# Patient Record
Sex: Female | Born: 1950 | Race: White | Hispanic: No | Marital: Married | Smoking: Never smoker
Health system: Southern US, Community
[De-identification: ages and names within clinical notes are randomized; demographics above are authoritative.]

## PROBLEM LIST (undated history)

## (undated) DIAGNOSIS — C801 Malignant (primary) neoplasm, unspecified: Secondary | ICD-10-CM

## (undated) DIAGNOSIS — N946 Dysmenorrhea, unspecified: Secondary | ICD-10-CM

## (undated) DIAGNOSIS — M722 Plantar fascial fibromatosis: Secondary | ICD-10-CM

## (undated) DIAGNOSIS — E079 Disorder of thyroid, unspecified: Secondary | ICD-10-CM

## (undated) DIAGNOSIS — N979 Female infertility, unspecified: Secondary | ICD-10-CM

## (undated) DIAGNOSIS — D219 Benign neoplasm of connective and other soft tissue, unspecified: Secondary | ICD-10-CM

## (undated) DIAGNOSIS — D649 Anemia, unspecified: Secondary | ICD-10-CM

## (undated) DIAGNOSIS — M810 Age-related osteoporosis without current pathological fracture: Secondary | ICD-10-CM

## (undated) HISTORY — DX: Plantar fascial fibromatosis: M72.2

## (undated) HISTORY — PX: DILATION AND CURETTAGE OF UTERUS: SHX78

## (undated) HISTORY — DX: Dysmenorrhea, unspecified: N94.6

## (undated) HISTORY — DX: Disorder of thyroid, unspecified: E07.9

## (undated) HISTORY — DX: Age-related osteoporosis without current pathological fracture: M81.0

## (undated) HISTORY — DX: Female infertility, unspecified: N97.9

## (undated) HISTORY — DX: Benign neoplasm of connective and other soft tissue, unspecified: D21.9

## (undated) HISTORY — DX: Anemia, unspecified: D64.9

## (undated) HISTORY — DX: Malignant (primary) neoplasm, unspecified: C80.1

---
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
## 1986-09-04 HISTORY — PX: PELVIC LAPAROSCOPY: SHX162

## 1998-09-04 DIAGNOSIS — C801 Malignant (primary) neoplasm, unspecified: Secondary | ICD-10-CM

## 1998-09-04 DIAGNOSIS — E079 Disorder of thyroid, unspecified: Secondary | ICD-10-CM

## 1998-09-04 HISTORY — DX: Malignant (primary) neoplasm, unspecified: C80.1

## 1998-09-04 HISTORY — DX: Disorder of thyroid, unspecified: E07.9

## 2000-10-12 ENCOUNTER — Encounter (INDEPENDENT_AMBULATORY_CARE_PROVIDER_SITE_OTHER): Payer: Self-pay | Admitting: Specialist

## 2000-10-12 ENCOUNTER — Other Ambulatory Visit: Admission: RE | Admit: 2000-10-12 | Discharge: 2000-10-12 | Payer: Self-pay | Admitting: Obstetrics and Gynecology

## 2001-04-22 ENCOUNTER — Ambulatory Visit (HOSPITAL_COMMUNITY): Admission: RE | Admit: 2001-04-22 | Discharge: 2001-04-22 | Payer: Self-pay | Admitting: Gynecology

## 2001-04-22 ENCOUNTER — Encounter: Payer: Self-pay | Admitting: Gynecology

## 2003-08-12 ENCOUNTER — Other Ambulatory Visit: Admission: RE | Admit: 2003-08-12 | Discharge: 2003-08-12 | Payer: Self-pay | Admitting: Obstetrics and Gynecology

## 2003-08-27 ENCOUNTER — Ambulatory Visit (HOSPITAL_COMMUNITY): Admission: RE | Admit: 2003-08-27 | Discharge: 2003-08-27 | Payer: Self-pay | Admitting: Obstetrics and Gynecology

## 2003-08-27 ENCOUNTER — Encounter (INDEPENDENT_AMBULATORY_CARE_PROVIDER_SITE_OTHER): Payer: Self-pay

## 2003-10-09 ENCOUNTER — Encounter (INDEPENDENT_AMBULATORY_CARE_PROVIDER_SITE_OTHER): Payer: Self-pay | Admitting: *Deleted

## 2003-10-09 ENCOUNTER — Ambulatory Visit (HOSPITAL_COMMUNITY): Admission: RE | Admit: 2003-10-09 | Discharge: 2003-10-09 | Payer: Self-pay | Admitting: Gastroenterology

## 2004-10-17 ENCOUNTER — Other Ambulatory Visit: Admission: RE | Admit: 2004-10-17 | Discharge: 2004-10-17 | Payer: Self-pay | Admitting: Obstetrics and Gynecology

## 2005-11-24 ENCOUNTER — Other Ambulatory Visit: Admission: RE | Admit: 2005-11-24 | Discharge: 2005-11-24 | Payer: Self-pay | Admitting: Obstetrics and Gynecology

## 2006-09-04 DIAGNOSIS — M810 Age-related osteoporosis without current pathological fracture: Secondary | ICD-10-CM

## 2006-09-04 HISTORY — DX: Age-related osteoporosis without current pathological fracture: M81.0

## 2009-03-09 ENCOUNTER — Other Ambulatory Visit: Admission: RE | Admit: 2009-03-09 | Discharge: 2009-03-09 | Payer: Self-pay | Admitting: Family Medicine

## 2009-04-20 ENCOUNTER — Encounter: Admission: RE | Admit: 2009-04-20 | Discharge: 2009-04-20 | Payer: Self-pay | Admitting: Internal Medicine

## 2011-01-20 NOTE — Op Note (Signed)
NAME:  Wanda Miller, Wanda Miller                         ACCOUNT NO.:  [id number]   MEDICAL RECORD NO.:  [id number]                   PATIENT TYPE:  AMB   LOCATION:  ENDO                                 FACILITY:  [facility]   PHYSICIAN:  John C. Madilyn Fireman, M.D.                 DATE OF BIRTH:  1951/05/24   DATE OF PROCEDURE:  10/09/2003  DATE OF DISCHARGE:                                 OPERATIVE REPORT   PROCEDURE:  Colonoscopy.   INDICATIONS FOR PROCEDURE:  Colon cancer screening in a 60 year old patient  with no prior screening.   DESCRIPTION OF PROCEDURE:  The patient was placed in the left lateral  decubitus position and placed on the pulse monitor with continuous low flow  oxygen delivered by nasal cannula. She was sedated with 100 micrograms of IV  Demerol and 10 mg of IV Versed.   The Olympus video colonoscope was inserted into the rectum and advanced to  the cecum, confirmed by transillumination of McBurney's point and  visualization of the ileocecal valve and appendiceal orifice.  The prep was  excellent.   The cecum, ascending, transverse, descending and sigmoid colon all appeared  normal with no masses, polyps, diverticula or other mucosal abnormalities.  Within the rectum there was seen an 8-mm sessile polyp which was fulgurated  by hot biopsy. The remainder of the rectum appeared normal.   The colonoscope was then withdrawn and the patient was returned to the  recovery room in stable condition. She tolerated the procedure well and  there were no immediate complications.   IMPRESSION:  Rectal polyp, otherwise normal study.   PLAN:  Await histology to determine method and interval for future colon  screening.                                               John C. Madilyn Fireman, M.D.    JCH/MEDQ  D:  10/09/2003  T:  10/09/2003  Job:  [id number]   cc:   Vikki Ports, M.D.  [address]  [place]  [place] [address]  Fax: [phone]

## 2011-01-20 NOTE — Op Note (Signed)
NAME:  Wanda Miller, Wanda Miller                         ACCOUNT NO.:  [id number]   MEDICAL RECORD NO.:  [id number]                   PATIENT TYPE:  AMB   LOCATION:  SDC                                  FACILITY:  [facility]   PHYSICIAN:  Randye Lobo, M.D.                DATE OF BIRTH:  07/27/1951   DATE OF PROCEDURE:  08/27/2003  DATE OF DISCHARGE:                                 OPERATIVE REPORT   PREOPERATIVE DIAGNOSES:  1. Cervical fibroid.  2. Uterine leiomyomata.  3. Postmenopausal bleeding.   POSTOPERATIVE DIAGNOSIS:  Uterine leiomyomata.   PROCEDURES:  1. Diagnostic hysteroscopy.  2. Dilation and curettage.   SURGEON:  Randye Lobo, M.D.   ANESTHESIA:  LMA, paracervical block with 1% lidocaine.   FLUIDS REPLACED:  1000 mL Ringer's lactate.   ESTIMATED BLOOD LOSS:  Minimal.   URINE OUTPUT:  150 mL.   COMPLICATIONS:  None.   INDICATION FOR PROCEDURE:  The patient is a 60 year old gravida 48 Caucasian  female on estrogen and progesterone therapy for menopausal symptoms, who  presented to the office for a usual examination, noting intermittent  spotting.  The patient has known uterine leiomyomata from prior ultrasound  evaluation.  Exam in the office demonstrated a prolapsing cervical fibroid.  This fibroid was felt to be firmly attached and was small.  It was not  possible to remove it in an office setting.  A plan was made to proceed with  a hysteroscopy with D&C and removal of the cervical fibroid after risks,  benefits, and alternatives were discussed with her.   FINDINGS:  Exam under anesthesia revealed a 10-week size uterus with a large  posterior lower uterine segment and fundal fibroid.  No adnexal masses were  appreciated.   At the time of hysteroscopy, the uterine cavity was noted to be almost  completely obliterated with both large anterior and posterior fibroids  encompassing essentially the entire fundus and lower uterine segment.  These  both did have  submucosal components to them, the posterior fibroid being  larger than the anterior fibroid.  There was thickened endometrium which was  appreciated.  There was no evidence of a cervical fibroid at the time of the  hysteroscopy-D&C.  The ostia of the fallopian tubes could not be visualized  well secondary to the large fibroids.   SPECIMENS:  Endometrial curettings were sent to pathology.   DESCRIPTION OF PROCEDURE:  With an IV in place, the patient was taken to the  operating room after she was properly identified.  The patient did receive  Ancef 1 g intravenously for antibiotic prophylaxis.  In the operating room,  LMA anesthesia was induced and the patient was then placed in the dorsal  lithotomy position.  The vagina and perineum were sterilely prepped and  draped and a Foley catheter was sterilely placed inside the bladder.  Then  the exam under anesthesia was performed  and the findings are as noted above.   A speculum was placed inside the vagina and the single-tooth tenaculum was  placed on the anterior cervical lip.  The uterus was sounded to  approximately 10 cm.  A paracervical block was performed with a total of 10  mL of 1% lidocaine in standard fashion.  The cervix was dilated at this time  to a #19 Pratt dilator, and the hysteroscope was inserted through the cervix  into the uterine cavity under the continuous infusion of 3% sorbitol  solution.  The findings are as noted above.  The diagnostic hysteroscope was  removed, and the cervix was further dilated to a #23 Pratt dilator.  A  smooth and then a serrated curette were used to curette the thickened  endometrium in all four quadrants of the uterine cavity, and the specimen  was then sent to pathology.  The diagnostic hysteroscope was inserted after  a final curettage was performed.  There was no evidence of any thickened  endometrium remaining.  The hysteroscope was removed, as were the remaining  vaginal instruments and  the Foley catheter.  Hemostasis was noted to be  excellent.   The patient was cleansed of any remaining Betadine.  She was awakened and  escorted to the recovery room in stable condition.  There were no  complications to the procedure.  All needle, instrument, and sponge counts  were correct.                                               Randye Lobo, M.D.    BES/MEDQ  D:  08/27/2003  T:  08/27/2003  Job:  [id number]

## 2011-08-22 ENCOUNTER — Encounter: Payer: Self-pay | Admitting: Emergency Medicine

## 2011-08-22 ENCOUNTER — Emergency Department (HOSPITAL_COMMUNITY): Payer: No Typology Code available for payment source

## 2011-08-22 ENCOUNTER — Emergency Department (HOSPITAL_COMMUNITY)
Admission: EM | Admit: 2011-08-22 | Discharge: 2011-08-22 | Disposition: A | Discharge status: Home / Self Care | Payer: No Typology Code available for payment source | Attending: Emergency Medicine | Admitting: Emergency Medicine

## 2011-08-22 DIAGNOSIS — S2020XA Contusion of thorax, unspecified, initial encounter: Secondary | ICD-10-CM | POA: Insufficient documentation

## 2011-08-22 DIAGNOSIS — S300XXA Contusion of lower back and pelvis, initial encounter: Secondary | ICD-10-CM

## 2011-08-22 DIAGNOSIS — M542 Cervicalgia: Secondary | ICD-10-CM | POA: Insufficient documentation

## 2011-08-22 DIAGNOSIS — Y92410 Unspecified street and highway as the place of occurrence of the external cause: Secondary | ICD-10-CM | POA: Insufficient documentation

## 2011-08-22 DIAGNOSIS — M25559 Pain in unspecified hip: Secondary | ICD-10-CM | POA: Insufficient documentation

## 2011-08-22 MED ORDER — HYDROCODONE-ACETAMINOPHEN 5-325 MG PO TABS: 1.0000 | ORAL_TABLET | ORAL | Status: AC | PRN
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
## 2011-08-22 NOTE — ED Notes (Signed)
Pt not in acute distress and verbalized understanding of discharge medications
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
## 2012-04-22 ENCOUNTER — Other Ambulatory Visit (HOSPITAL_COMMUNITY)
Admission: RE | Admit: 2012-04-22 | Discharge: 2012-04-22 | Disposition: A | Discharge status: Home / Self Care | Payer: BC Managed Care – PPO | Source: Ambulatory Visit | Attending: Family Medicine | Admitting: Family Medicine

## 2012-04-22 ENCOUNTER — Other Ambulatory Visit: Payer: Self-pay | Admitting: Family Medicine

## 2012-04-22 DIAGNOSIS — Z0000 Encounter for general adult medical examination without abnormal findings: Secondary | ICD-10-CM | POA: Insufficient documentation
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
## 2013-11-12 ENCOUNTER — Ambulatory Visit (INDEPENDENT_AMBULATORY_CARE_PROVIDER_SITE_OTHER): Payer: BC Managed Care – PPO | Admitting: Obstetrics and Gynecology

## 2013-11-12 ENCOUNTER — Encounter: Payer: Self-pay | Admitting: Obstetrics and Gynecology

## 2013-11-12 VITALS — BP 143/73 | HR 76 | Resp 16 | Ht 62.75 in | Wt 148.0 lb

## 2013-11-12 DIAGNOSIS — Z0000 Encounter for general adult medical examination without abnormal findings: Secondary | ICD-10-CM

## 2013-11-12 DIAGNOSIS — M949 Disorder of cartilage, unspecified: Secondary | ICD-10-CM

## 2013-11-12 DIAGNOSIS — Z1211 Encounter for screening for malignant neoplasm of colon: Secondary | ICD-10-CM

## 2013-11-12 DIAGNOSIS — M899 Disorder of bone, unspecified: Secondary | ICD-10-CM

## 2013-11-12 DIAGNOSIS — M8580 Other specified disorders of bone density and structure, unspecified site: Secondary | ICD-10-CM

## 2013-11-12 DIAGNOSIS — Z01419 Encounter for gynecological examination (general) (routine) without abnormal findings: Secondary | ICD-10-CM

## 2013-11-12 DIAGNOSIS — Z86010 Personal history of colonic polyps: Secondary | ICD-10-CM

## 2013-11-12 LAB — POCT URINALYSIS DIPSTICK
Bilirubin, UA: NEGATIVE
Blood, UA: NEGATIVE
GLUCOSE UA: NEGATIVE
KETONES UA: NEGATIVE
LEUKOCYTES UA: NEGATIVE
Nitrite, UA: NEGATIVE
PROTEIN UA: NEGATIVE
UROBILINOGEN UA: NEGATIVE
pH, UA: 5

## 2013-11-12 NOTE — Patient Instructions (Signed)

## 2013-11-12 NOTE — Progress Notes (Signed)
GYNECOLOGY VISIT   PCP: Mingo Amber, MD  Referring provider:   HPI: 63 y.o.   Married  Caucasian  female   G0P0 with No LMP recorded. Patient is postmenopausal.   here for   NGYN/ Annual Exam. LMP - 2008.  Occasional hot flash.  Doing homeopathic therapy with Dr. Jaynie Collins. Not on hormonal therapy.  Known uterine fibroid, confirmed by prior outside ultrasound.   Doing cholesterol treatment.  Hgb:  PCP Urine:  Neg  GYNECOLOGIC HISTORY: No LMP recorded. Patient is postmenopausal. Sexually active:  yes Partner preference: female Contraception:   Menopausal Menopausal hormone therapy: no DES exposure:  no  Blood transfusions: no   Sexually transmitted diseases:   no GYN procedures and prior surgeries:  D&C between 2003-2005 Last mammogram:     2012 normal ([facility]).   Last pap and high risk HPV testing:   04/2012 Neg, not sure if HPV Testing was done History of abnormal pap smear:  no   OB History   Grav Para Term Preterm Abortions TAB SAB Ect Mult Living   0                LIFESTYLE: Exercise:  Cardio, [employer] 2 days a week             Tobacco: no Alcohol: occ glass of wine Drug use:  no  OTHER HEALTH MAINTENANCE: Tetanus/TDap: 2012 Gardisil: no Influenza: no  Zostavax: no  Bone density: 2008 Osteopenia.  Exam at [facility].  Colonoscopy: 2004 (1) polyp, repeat in 10 years  Cholesterol check: 10/2013  Elevated   Total 296    Family History  Problem Relation Age of Onset  . Osteoarthritis Mother   . Hypertension Father   . Diabetes Father   . Cancer Father 81    stomach cancer  . Breast cancer Sister     early 37's  . Stroke Paternal Grandmother   . Diabetes Paternal Grandfather   . Thyroid disease Sister 51    Thyroid cancer    There are no active problems to display for this patient.  Past Medical History  Diagnosis Date  . Anemia   . Cancer 2000    Thyroid  . Dysmenorrhea   . Fibroid   . Infertility, female     testing in the 65's  .  Osteoporosis 2008    Osteopenia hips  . Thyroid disease 2000    Thyroid cancer    Past Surgical History  Procedure Laterality Date  . Dilation and curettage of uterus      2003-2005  . Pelvic laparoscopy  1988    R/O Endometriosis  . Tonsillectomy  1962  . Elevation of depressed skull fracture  1955    as a baby    ALLERGIES: Review of patient's allergies indicates no known allergies.  Current Outpatient Prescriptions  Medication Sig Dispense Refill  . levothyroxine (SYNTHROID, LEVOTHROID) 100 MCG tablet Take 75 mcg by mouth daily.       . Multiple Vitamin (MULITIVITAMIN WITH MINERALS) TABS Take 1 tablet by mouth daily.         No current facility-administered medications for this visit.     ROS:  Pertinent items are noted in HPI.  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   PHYSICAL EXAMINATION:    BP 143/73  Pulse 76  Resp 16  Ht 5' 2.75" (1.594 m)  Wt 148 lb (67.132 kg)  BMI 26.42 kg/m2  Wt Readings from Last 3 Encounters:  11/12/13 148 lb (67.132 kg)     Ht Readings from Last 3 Encounters:  11/12/13 5' 2.75" (1.594 m)    General appearance: alert, cooperative and appears stated age Head: Normocephalic, without obvious abnormality, atraumatic Neck: no adenopathy, supple, symmetrical, trachea midline and thyroid not enlarged, symmetric, no tenderness/mass/nodules Lungs: clear to auscultation bilaterally Breasts: Inspection negative, No nipple retraction or dimpling, No nipple discharge or bleeding, No axillary or supraclavicular adenopathy, Normal to palpation without dominant masses Heart: regular rate and rhythm Abdomen: soft, non-tender; no masses,  no organomegaly Extremities: extremities normal, atraumatic, no cyanosis or edema Skin: Skin color, texture, turgor normal. No rashes or lesions Lymph nodes: Cervical, supraclavicular, and axillary nodes normal. No abnormal inguinal nodes  palpated Neurologic: Grossly normal  Pelvic: External genitalia:  no lesions              Urethra:  normal appearing urethra with no masses, tenderness or lesions              Bartholins and Skenes: normal                 Vagina: normal appearing vagina with normal color and discharge, no lesions              Cervix: normal appearance              Pap and high risk HPV testing done: yes           Bimanual Exam:  Uterus:  uterus is enlarged with 3 cm posterior fibroid 3 cm.                                       Adnexa: normal adnexa in size, nontender and no masses                                      Rectovaginal: Confirms                                      Anus:  normal sphincter tone, no lesions  ASSESSMENT  Normal gynecologic exam. Uterine fibroid.  Osteopenia.   PLAN  Mammogram recommended yearly and scheduled for patient. Bone density recommended and scheduled. Pap smear and high risk HPV testing performed.  Counseled on self breast exam, Calcium and vitamin D intake, exercise. Colonoscopy recommended. Return annually or prn   An After Visit Summary was printed and given to the patient.

## 2013-11-13 NOTE — Addendum Note (Signed)
Addended by: Tacy Learn, BROOK E on: 11/13/2013 11:55 AM   Modules accepted: Orders

## 2013-11-18 LAB — IPS PAP TEST WITH HPV

## 2013-11-25 ENCOUNTER — Telehealth: Payer: Self-pay | Admitting: Obstetrics and Gynecology

## 2013-11-25 NOTE — Telephone Encounter (Signed)
Calling to inform patient of results from DEXA at [facility] on 3/17. Patient aware no further treatment needed at this time and will recheck in 2 years.Patient verbalizes understanding about taking vitamin D and performing weight bearing exercises.   Routing to provider for final review. Patient agreeable to disposition. Will close encounter

## 2013-12-19 LAB — FECAL OCCULT BLOOD, IMMUNOCHEMICAL: IMMUNOLOGICAL FECAL OCCULT BLOOD TEST: NEGATIVE

## 2013-12-19 NOTE — Addendum Note (Signed)
Addended by: Graylon Good on: 12/19/2013 09:39 AM   Modules accepted: Orders

## 2013-12-30 ENCOUNTER — Encounter: Payer: Self-pay | Admitting: Obstetrics and Gynecology

## 2014-11-18 ENCOUNTER — Ambulatory Visit (INDEPENDENT_AMBULATORY_CARE_PROVIDER_SITE_OTHER): Payer: 59 | Admitting: Obstetrics and Gynecology

## 2014-11-18 ENCOUNTER — Encounter: Payer: Self-pay | Admitting: Obstetrics and Gynecology

## 2014-11-18 DIAGNOSIS — Z01419 Encounter for gynecological examination (general) (routine) without abnormal findings: Secondary | ICD-10-CM

## 2014-11-18 DIAGNOSIS — N950 Postmenopausal bleeding: Secondary | ICD-10-CM

## 2014-11-18 DIAGNOSIS — Z0000 Encounter for general adult medical examination without abnormal findings: Secondary | ICD-10-CM

## 2014-11-18 LAB — POCT URINALYSIS DIPSTICK
BILIRUBIN UA: NEGATIVE
Blood, UA: NEGATIVE
GLUCOSE UA: NEGATIVE
KETONES UA: NEGATIVE
LEUKOCYTES UA: NEGATIVE
Nitrite, UA: NEGATIVE
Protein, UA: NEGATIVE
Urobilinogen, UA: NEGATIVE
pH, UA: 5

## 2014-11-18 NOTE — Patient Instructions (Signed)

## 2014-11-18 NOTE — Progress Notes (Signed)
Patient ID: Wanda Miller, female   DOB: 12-Mar-1951, 64 y.o.   MRN: [id number] 64 y.o. G0P0 MarriedCaucasianF here for annual exam.    Taking herbal therapy called Protandem, antioxidant.  Not taking any hormonal therapy.  Has chronic fatigue.  Has some bleeding with wiping once every 6 months.  Has a uterine fibroid.  Taking vit D3 sublingual.  Taking calcium supplement from Dr. Montel Clock.   Husband has just retired.   PCP:  Leighton Ruff, MD - will establish care there.   No LMP recorded. Patient is postmenopausal.          Sexually active: Yes.   female partner The current method of family planning is post menopausal status.    Exercising: No.  walking, weights and elyptical. Smoker:  no  Health Maintenance: Pap:  11-13-13 wnl:neg HR HPV History of abnormal Pap:  no MMG:  11-10-13 71mm density right breast; ultrasound revealed 9mm oval complicated cyst at 12 o'clock right breast and 39mm oval cyst at 9 o'clock left breast--rec. Screening in one year:[facility] Colonoscopy:  2004 normal with Dr. Teena Irani.  Next colonoscopy due 2014. Pt. Past due. BMD:   11-21-13 osteopenia hip/spine--repeat 2 years/[facility] TDaP:  2012 Screening Labs:   Hb today: PCP, Urine today: Neg   reports that she has never smoked. She has never used smokeless tobacco. She reports that she does not drink alcohol or use illicit drugs.  Past Medical History  Diagnosis Date  . Anemia   . Cancer 2000    Thyroid  . Dysmenorrhea   . Fibroid   . Infertility, female     testing in the 5's  . Osteoporosis 2008    Osteopenia hips  . Thyroid disease 2000    Thyroid cancer  . Plantar fasciitis     left foot    Past Surgical History  Procedure Laterality Date  . Dilation and curettage of uterus      2003-2005  . Pelvic laparoscopy  1988    R/O Endometriosis  . Tonsillectomy  1962  . Elevation of depressed skull fracture  1955    as a baby    Current Outpatient Prescriptions  Medication Sig Dispense  Refill  . levothyroxine (SYNTHROID, LEVOTHROID) 100 MCG tablet Take 75 mcg by mouth daily. Takes 74mcg qod    . levothyroxine (SYNTHROID, LEVOTHROID) 100 MCG tablet Take 100 mcg by mouth daily before breakfast. Takes 160mcg qod    . Multiple Vitamin (MULITIVITAMIN WITH MINERALS) TABS Take 1 tablet by mouth daily.       No current facility-administered medications for this visit.    Family History  Problem Relation Age of Onset  . Osteoarthritis Mother   . Hypertension Father   . Diabetes Father   . Cancer Father 32    stomach cancer  . Breast cancer Sister     early 45's  . Stroke Paternal Grandmother   . Diabetes Paternal Grandfather   . Thyroid disease Sister 78    Thyroid cancer    ROS:  Pertinent items are noted in HPI.  Otherwise, a comprehensive ROS was negative.  Exam:   There were no vitals taken for this visit.        Ht Readings from Last 3 Encounters:  11/12/13 5' 2.75" (1.594 m)    General appearance: alert, cooperative and appears stated age Head: Normocephalic, without obvious abnormality, atraumatic Neck: no adenopathy, supple, symmetrical, trachea midline and thyroid normal to inspection and palpation Lungs: clear to auscultation bilaterally  Breasts: normal appearance, no masses or tenderness, Inspection negative, No nipple retraction or dimpling, No nipple discharge or bleeding, No axillary or supraclavicular adenopathy Heart: regular rate and rhythm Abdomen: soft, non-tender; bowel sounds normal; no masses,  no organomegaly Extremities: extremities normal, atraumatic, no cyanosis or edema Skin: Skin color, texture, turgor normal. No rashes or lesions Lymph nodes: Cervical, supraclavicular, and axillary nodes normal. No abnormal inguinal nodes palpated Neurologic: Grossly normal   Pelvic: External genitalia:  no lesions              Urethra:  normal appearing urethra with no masses, tenderness or lesions              Bartholins and Skenes: normal                  Vagina: normal appearing vagina with normal color and discharge, no lesions              Cervix: no lesions              Pap taken: No. Bimanual Exam:  Uterus:  normal size, contour, position, consistency, mobility, non-tender              Adnexa: normal adnexa               Rectovaginal: Confirms               Anus:  normal sphincter tone, no lesions  Chaperone was present for exam.  A:  Well Woman with normal exam Osteopenia.  Postmenopausal bleeding.  History of uterine fibroid.   P:   Mammogram recommended.  Patient will call to schedule. Discussed calcium, vit D, and weight bearing exercise.  pap smear not indicated this year. Patient will return for pelvic ultrasound.  She is very hesitant to do an EMB.  I stated that we would do the ultrasound first to see what the anatomy is and if the biopsy is indicated. Colonoscopy recommended.  Labs with PCP.  return annually or prn

## 2014-11-20 ENCOUNTER — Telehealth: Payer: Self-pay | Admitting: Obstetrics and Gynecology

## 2014-11-20 NOTE — Telephone Encounter (Signed)
Left message for patient. Need to go over benefits and schedule PUS.

## 2014-11-24 NOTE — Telephone Encounter (Signed)
Spoke to patient. Advised of benefit quote received for PUS.  Patient agreeable. Scheduled PUS. Advised patient of 72 hour cancellation policy and $349 cancellation fee. Patient agreeable.

## 2014-12-15 ENCOUNTER — Other Ambulatory Visit: Payer: 59

## 2014-12-24 ENCOUNTER — Ambulatory Visit (INDEPENDENT_AMBULATORY_CARE_PROVIDER_SITE_OTHER): Payer: 59

## 2014-12-24 ENCOUNTER — Encounter: Payer: Self-pay | Admitting: Obstetrics and Gynecology

## 2014-12-24 ENCOUNTER — Ambulatory Visit (INDEPENDENT_AMBULATORY_CARE_PROVIDER_SITE_OTHER): Payer: 59 | Admitting: Obstetrics and Gynecology

## 2014-12-24 VITALS — BP 120/80 | Resp 12 | Ht 62.75 in | Wt 148.0 lb

## 2014-12-24 DIAGNOSIS — N8320 Unspecified ovarian cysts: Secondary | ICD-10-CM

## 2014-12-24 DIAGNOSIS — R9389 Abnormal findings on diagnostic imaging of other specified body structures: Secondary | ICD-10-CM

## 2014-12-24 DIAGNOSIS — D259 Leiomyoma of uterus, unspecified: Secondary | ICD-10-CM | POA: Diagnosis not present

## 2014-12-24 DIAGNOSIS — N83201 Unspecified ovarian cyst, right side: Secondary | ICD-10-CM

## 2014-12-24 DIAGNOSIS — R938 Abnormal findings on diagnostic imaging of other specified body structures: Secondary | ICD-10-CM

## 2014-12-24 DIAGNOSIS — N950 Postmenopausal bleeding: Secondary | ICD-10-CM | POA: Diagnosis not present

## 2014-12-24 NOTE — Patient Instructions (Signed)
Endometrial Biopsy, Care After Refer to this sheet in the next few weeks. These instructions provide you with information on caring for yourself after your procedure. Your health care provider may also give you more specific instructions. Your treatment has been planned according to current medical practices, but problems sometimes occur. Call your health care provider if you have any problems or questions after your procedure. WHAT TO EXPECT AFTER THE PROCEDURE After your procedure, it is typical to have the following:  You may have mild cramping and a small amount of vaginal bleeding for a few days after the procedure. This is normal. HOME CARE INSTRUCTIONS  Only take over-the-counter or prescription medicine as directed by your health care provider.  Do not douche, use tampons, or have sexual intercourse until your health care provider approves.  Follow your health care provider's instructions regarding any activity restrictions, such as strenuous exercise or heavy lifting. SEEK MEDICAL CARE IF:  You have heavy bleeding or bleeding longer than 2 days after the procedure.  You have bad smelling drainage from your vagina.  You have a fever and chills.  Youhave severe lower stomach (abdominal) pain. SEEK IMMEDIATE MEDICAL CARE IF:  You have severe cramps in your stomach or back.  You pass large blood clots.  Your bleeding increases.  You become weak or lightheaded, or you pass out. Document Released: 06/11/2013 Document Reviewed: 06/11/2013 Exit[employer] Patient Information 2015 [employer]. This information is not intended to replace advice given to you by your health care provider. Make sure you discuss any questions you have with your health care provider.  

## 2014-12-24 NOTE — Progress Notes (Signed)
Patient ID: Wanda Miller, female   DOB: Mar 15, 1951, 64 y.o.   MRN: [id number]  GYNECOLOGY  VISIT   HPI: 64 y.o.   Married  Caucasian  female   G0P0 with No LMP recorded. Patient is postmenopausal.   here for pelvic ultrasound and possible EMB.  Has some bleeding with wiping once every 6 months.  Has a history of a uterine fibroid.   Not taking any HRT.  History of prior dilation and curettage.  GYNECOLOGIC HISTORY: No LMP recorded. Patient is postmenopausal.          OB History    Gravida Para Term Preterm AB TAB SAB Ectopic Multiple Living   0                  There are no active problems to display for this patient.   Past Medical History  Diagnosis Date  . Anemia   . Cancer 2000    Thyroid  . Dysmenorrhea   . Fibroid   . Infertility, female     testing in the 58's  . Osteoporosis 2008    Osteopenia hips  . Thyroid disease 2000    Thyroid cancer  . Plantar fasciitis     left foot    Past Surgical History  Procedure Laterality Date  . Dilation and curettage of uterus      2003-2005  . Pelvic laparoscopy  1988    R/O Endometriosis  . Tonsillectomy  1962  . Elevation of depressed skull fracture  1955    as a baby    Current Outpatient Prescriptions  Medication Sig Dispense Refill  . levothyroxine (SYNTHROID, LEVOTHROID) 100 MCG tablet Take 75 mcg by mouth daily. Takes 13mcg qod    . levothyroxine (SYNTHROID, LEVOTHROID) 100 MCG tablet Take 100 mcg by mouth daily before breakfast. Takes 18mcg qod    . Multiple Vitamin (MULITIVITAMIN WITH MINERALS) TABS Take 1 tablet by mouth daily.       No current facility-administered medications for this visit.     ALLERGIES: Review of patient's allergies indicates no known allergies.  Family History  Problem Relation Age of Onset  . Osteoarthritis Mother   . Hypertension Father   . Diabetes Father   . Cancer Father 43    stomach cancer  . Breast cancer Sister     early 85's  . Stroke Paternal  Grandmother   . Diabetes Paternal Grandfather   . Thyroid disease Sister 20    Thyroid cancer    History   Social History  . Marital Status: Married    Spouse Name: N/A  . Number of Children: N/A  . Years of Education: N/A   Occupational History  . Not on file.   Social History Main Topics  . Smoking status: Never Smoker   . Smokeless tobacco: Never Used  . Alcohol Use: No     Comment: occ glass of wine  . Drug Use: No  . Sexual Activity:    Partners: Male    Birth Control/ Protection: Post-menopausal   Other Topics Concern  . Not on file   Social History Narrative    ROS:  Pertinent items are noted in HPI.  PHYSICAL EXAMINATION:    BP 120/80 mmHg  Resp 12  Ht 5' 2.75" (1.594 m)  Wt 148 lb (67.132 kg)  BMI 26.42 kg/m2     General appearance: alert, cooperative and appears stated age   Pelvic ultrasound - images and report reviewed with patient.  Uterus with 5 fibroids  - 1.99 cm, 4.1 cm, 1.23 cm, 1.95 cm, 1.13 cm. EMS - 6.95 cm with cystic area.  Right ovary with 23 x 17 mm smooth walled cyst with no echoes, no abnormal blood flow.  Normal left ovary.  No free fluid.   EMB -  Consent for procedure.  Speculum placed.  Hibiclens prep.  Paracervical block with 10 cc of 1% lidocaine - lot [id number], exp. 02/03/15.  Os finder and then dilator used.  Pipelle to about 7 cm.  Passed twice.  Tissue to pathology.  No complications.  Minimal EBL.  ASSESSMENT  Postmenopausal bleeding.  Uterine fibroids.  Cystic endometrium.  Right ovarian cyst.   PLAN  Discussion of fibroids, thickened endometrium, ovarian cyst.  Check CA125. Follow up EMB.  Instructions and precautions given.  Final plan to follow.    An After Visit Summary was printed and given to the patient.  ___15___ minutes face to face time of which over 50% was spent in counseling.

## 2014-12-25 ENCOUNTER — Telehealth: Payer: Self-pay | Admitting: Emergency Medicine

## 2014-12-25 LAB — CA 125: CA 125: 6 U/mL (ref <35)

## 2014-12-25 NOTE — Telephone Encounter (Signed)
-----   Message from Nunzio Cobbs, MD sent at 12/25/2014 10:09 AM EDT ----- Please inform patient of normal CA125 level.  Endometrial biopsy results will be ready toward the end of the week next week.   Cc- Marisa Sprinkles

## 2014-12-25 NOTE — Telephone Encounter (Signed)
Patient informed of results and is agreeable.  Will follow up prn.  Routing to provider for final review. Patient agreeable to disposition. Will close encounter

## 2014-12-28 LAB — IPS OTHER TISSUE BIOPSY

## 2014-12-30 ENCOUNTER — Telehealth: Payer: Self-pay

## 2014-12-30 NOTE — Telephone Encounter (Signed)
Left message to call Kaitlyn at [phone]. 

## 2014-12-30 NOTE — Telephone Encounter (Signed)
-----   Message from Nunzio Cobbs, MD sent at 12/30/2014  8:04 AM EDT ----- Please let patient know that her endometrial biopsy showed benign atrophic endometrium.  There was no sign of atypia or cancer The amount of tissue obtained was small.  There is nothing further that needs to be done at this time.  Please have her call the office if she experiences any further vaginal bleeding or spotting.

## 2014-12-31 NOTE — Telephone Encounter (Signed)
Spoke with patient. Advised of results as seen below from Dr.Silva. Patient is agreeable and verbalizes understanding.  Routing to provider for final review. Patient agreeable to disposition. Will close encounter.     

## 2015-12-01 ENCOUNTER — Telehealth: Payer: Self-pay | Admitting: Obstetrics and Gynecology

## 2015-12-01 ENCOUNTER — Ambulatory Visit: Payer: 59 | Admitting: Obstetrics and Gynecology

## 2015-12-01 NOTE — Telephone Encounter (Signed)
Patient was here for appointment and had to reschedule. See staff message.

## 2015-12-01 NOTE — Telephone Encounter (Signed)
Thank you.  I have closed the encounter. 

## 2015-12-13 ENCOUNTER — Telehealth: Payer: Self-pay | Admitting: Obstetrics and Gynecology

## 2015-12-13 ENCOUNTER — Ambulatory Visit (INDEPENDENT_AMBULATORY_CARE_PROVIDER_SITE_OTHER): Payer: BLUE CROSS/BLUE SHIELD | Admitting: Obstetrics and Gynecology

## 2015-12-13 ENCOUNTER — Encounter: Payer: Self-pay | Admitting: Obstetrics and Gynecology

## 2015-12-13 VITALS — BP 120/64 | HR 80 | Resp 14 | Ht 63.0 in | Wt 156.6 lb

## 2015-12-13 DIAGNOSIS — N950 Postmenopausal bleeding: Secondary | ICD-10-CM | POA: Diagnosis not present

## 2015-12-13 DIAGNOSIS — Z01419 Encounter for gynecological examination (general) (routine) without abnormal findings: Secondary | ICD-10-CM | POA: Diagnosis not present

## 2015-12-13 NOTE — Progress Notes (Signed)
3Dscreening MMG scheduled for 12-15-15 at 1145 am at [facility]. PUS/SHGM, possible endometrial biopsy schedule for 12-23-15 here in office. Instructed to take Motrin 800 mg one hour prior with food. Patient agreeable to all appointments.

## 2015-12-13 NOTE — Telephone Encounter (Signed)
Spoke with patient regarding benefits for ultrasound and sonohystogram. Patient understood and agreeable. Patient understood and agreeable to arrival date/time. Patient understood and agreeable to 72 hour cancellation policy with [id number] fee. No further questions. Ok to close.

## 2015-12-13 NOTE — Progress Notes (Signed)
Patient ID: Wanda Miller, female   DOB: May 05, 1951, 65 y.o.   MRN: [id number] 65 y.o. G0P0 Married Caucasian female here for annual exam.    Had some dark brown crusting in her underwear in March.  No pain.  Not taking HRT.  Hx of PMB. Hx of fibroids, simple right ovarian cyst 17 x 23 mm, and thickened endometrium - 6.95 mm by ultrasound one year ago. EMB benign.  CA125 was 6.  Moving to [place] after May 25.  Has sold her home.   Endocrinology - Dr. Buddy Duty.  Dr. Catheryn Bacon.   Profession - Scientific laboratory technician.  PCP:   None  Patient's last menstrual period was 09/04/2006 (approximate).       Sexually active: Yes.   female The current method of family planning is post menopausal status.    Exercising: No.   Smoker:  no  Health Maintenance: Pap:  11-13-13 Neg:Neg HR HPV History of abnormal Pap:  no MMG:  11-10-13 5 mm density  In Rt.breast, U/S revealed 69mm oval complicated cyst at 12 o'clock and 61mm oval cyst at 9 o'clock--rec.screening 1year:[facility] Colonoscopy:  2004 normal with Dr. Amedeo Plenty.  Declines this.  BMD:  11-18-13 Result  Osteopenia:[facility] TDaP:  2012 Gardasil:   n/a HIV: PCP. Hep C: PCP. Screening Labs:  Hb today: Endocrinologist, Urine today: declined.    reports that she has never smoked. She has never used smokeless tobacco. She reports that she does not drink alcohol or use illicit drugs.  Past Medical History  Diagnosis Date  . Anemia   . Cancer ([place]) 2000    Thyroid  . Dysmenorrhea   . Fibroid   . Infertility, female     testing in the 76's  . Osteoporosis 2008    Osteopenia hips  . Thyroid disease 2000    Thyroid cancer  . Plantar fasciitis     left foot    Past Surgical History  Procedure Laterality Date  . Dilation and curettage of uterus      2003-2005  . Pelvic laparoscopy  1988    R/O Endometriosis  . Tonsillectomy  1962  . Elevation of depressed skull fracture  1955    as a baby    Current Outpatient Prescriptions  Medication Sig  Dispense Refill  . levothyroxine (SYNTHROID, LEVOTHROID) 100 MCG tablet Take 75 mcg by mouth daily. Takes 39mcg qod    . levothyroxine (SYNTHROID, LEVOTHROID) 100 MCG tablet Take 100 mcg by mouth daily before breakfast. Takes 167mcg qod    . Multiple Vitamin (MULITIVITAMIN WITH MINERALS) TABS Take 1 tablet by mouth daily.       No current facility-administered medications for this visit.    Family History  Problem Relation Age of Onset  . Osteoarthritis Mother   . Hypertension Father   . Diabetes Father   . Cancer Father 31    stomach cancer  . Breast cancer Sister     early 52's  . Stroke Paternal Grandmother   . Diabetes Paternal Grandfather   . Thyroid disease Sister 17    Thyroid cancer    ROS:  Pertinent items are noted in HPI.  Otherwise, a comprehensive ROS was negative.  Exam:   BP 120/64 mmHg  Pulse 80  Resp 14  Ht 5\' 3"  (1.6 m)  Wt 156 lb 9.6 oz (71.033 kg)  BMI 27.75 kg/m2  LMP 09/04/2006 (Approximate)    General appearance: alert, cooperative and appears stated age Head: Normocephalic, without obvious abnormality, atraumatic Neck:  no adenopathy, supple, symmetrical, trachea midline and no masses. Lungs: clear to auscultation bilaterally Breasts: normal appearance, no masses or tenderness, Inspection negative, No nipple retraction or dimpling, No nipple discharge or bleeding, No axillary or supraclavicular adenopathy Heart: regular rate and rhythm Abdomen: incisions:  No.    soft, non-tender; no masses, no organomegaly Extremities: extremities normal, atraumatic, no cyanosis or edema Skin: Skin color, texture, turgor normal. No rashes or lesions Lymph nodes: Cervical, supraclavicular, and axillary nodes normal. No abnormal inguinal nodes palpated Neurologic: Grossly normal  Pelvic: External genitalia:  no lesions              Urethra:  normal appearing urethra with no masses, tenderness or lesions              Bartholins and Skenes: normal                  Vagina: normal appearing vagina with normal color and discharge, no lesions              Cervix: no lesions              Pap taken: Yes.   Bimanual Exam:  Uterus:  normal size, contour, position, consistency, mobility, non-tender              Adnexa: normal adnexa and no mass, fullness, tenderness              Rectal exam: Yes.  .  Confirms.              Anus:  normal sphincter tone, no lesions  Chaperone was present for exam.  Assessment:   Well woman visit with normal exam. Postmenopausal bleeding. Hx of fibroids, thickened endometrium, and negative EMB. Hx right breast cyst.  Osteopenia.  Declines follow up bone density.   Plan: Yearly mammogram recommended after age 12.   Will assist patient to schedule mammogram. Recommended self breast exam.  Pap and HR HPV as above. Discussed Calcium, Vitamin D, regular exercise program including cardiovascular and weight bearing exercise. Labs performed.  No..     Prescription medication(s) given.  No..    Return for sonohysterogram and possible EMB.  I did discuss the need for a possible hysteroscopy and dilation and curettage. Follow up annually and prn.       After visit summary provided.

## 2015-12-13 NOTE — Patient Instructions (Signed)

## 2015-12-15 LAB — IPS PAP TEST WITH HPV

## 2015-12-23 ENCOUNTER — Ambulatory Visit (INDEPENDENT_AMBULATORY_CARE_PROVIDER_SITE_OTHER): Payer: BLUE CROSS/BLUE SHIELD

## 2015-12-23 ENCOUNTER — Encounter: Payer: Self-pay | Admitting: Obstetrics and Gynecology

## 2015-12-23 ENCOUNTER — Other Ambulatory Visit: Payer: Self-pay | Admitting: Obstetrics and Gynecology

## 2015-12-23 ENCOUNTER — Ambulatory Visit (INDEPENDENT_AMBULATORY_CARE_PROVIDER_SITE_OTHER): Payer: BLUE CROSS/BLUE SHIELD | Admitting: Obstetrics and Gynecology

## 2015-12-23 VITALS — BP 122/68 | HR 64 | Ht 63.0 in | Wt 155.6 lb

## 2015-12-23 DIAGNOSIS — N950 Postmenopausal bleeding: Secondary | ICD-10-CM

## 2015-12-23 DIAGNOSIS — N9489 Other specified conditions associated with female genital organs and menstrual cycle: Secondary | ICD-10-CM | POA: Diagnosis not present

## 2015-12-23 NOTE — Progress Notes (Signed)
Subjective  65 y.o. G0P0 Married Caucasian female here for pelvic ultrasound for postmenopausal bleeding.  Hx of prior thickened cystic endometrium and benign, negative EMB.   Planning to move in near future.  Going to [place] on 12/30/15 to look for a house and will return on 01/04/16.   Patient's last menstrual period was 09/04/2006 (approximate).  Objective  Pelvic ultrasound images and report reviewed with patient.  Uterus - 6 fibroid - 1.25 - 3.4 cm. EMS - 10.69 mm with cystic changes and feeder vessel noted. Ovaries - bilateral atrophic ovaries.  2.7  X 1.3 cm cystic structure adjacent to the right ovary - hydrosalpinx.  Free fluid - no       Assessment  Postmenopausal bleeding.  Endometrial mass. Probable endometrial polyp.  Plan  Discussion of postmenopausal bleeding and endometrial mass.  Etiologies discussed including endometrial polyp or potential cancer.  Discussion of hysteroscopy with Myosure polypectomy, dilation and curettage.  Risks, benefits, and alternatives reviewed. Risks include but are not limited to bleeding, infection, damage to surrounding organs including uterine perforation requiring hospitalization and laparoscopy, reaction to anesthesia, DVT, PE, death, need for further treatment or surgery including repeat hysteroscopy, hysterectomy or medical therapy.   Surgical expectations and recovery discussed.  Patient wishes to proceed.  __25_____ minutes face to face time of which over 50% was spent in counseling.   After visit summary to patient.

## 2015-12-27 ENCOUNTER — Telehealth: Payer: Self-pay | Admitting: Obstetrics and Gynecology

## 2015-12-27 NOTE — Telephone Encounter (Signed)
Spoke with pt regarding benefit for surgery. Patient understood and agreeable. Patient aware this is professional benefit only. Patient requested to contact the [facility] to obtain an estimate of facility benefits, prior to scheduling. Patient also advised, she will not know her availability for scheduling until 01/04/16, but states she would like to consider dates the week of 01/10/16 or 01/17/16, as she will be leaving for [place] on 01/27/16.  Routing to [facility] to review.

## 2016-01-06 NOTE — Telephone Encounter (Signed)
Called patient to follow up regarding recommended procedure. Left voicemail requesting a return call

## 2016-01-11 NOTE — Telephone Encounter (Signed)
Patient returned my call. Patient has declined to proceed with procedure, due to moving to [place] 01/27/16, she states "I don't have time to be down for four days and I don't need to add anything to my schedule." Routing to Dr Quincy Simmonds for final review

## 2016-01-11 NOTE — Telephone Encounter (Signed)
Call to patient. Advised calling to clarify decision regarding surgery. Patient confirms she does not want to proceed.  Advised surgery is recommended to further evaluate PMB, remove suspected endometrial polyp and evalute for potentially abnormal cells within uterine cavity up to and including cancer cells.  Patient states it is " too much at this time" and she will not  "subject my body to more punishment."    She states she doesn't have time to be recovering from procedure and implementing the move. She states she has made her decision and she expects we should honor her request. Advised we are only trying to be sure she is aware of risk of not proceeding. Delay till move to [place] will take time and delay in diagnosis could result in need for more advanced treatment.  Patient states she is fully aware and still declines to proceed, encouraged to begin planning for establishing care ASAP in [place] and transferring records. Advised will notify Dr Quincy Simmonds of decision.     Routing to provider for final review. Patient agreeable to disposition. Will close encounter.

## 2016-08-08 ENCOUNTER — Telehealth: Payer: Self-pay | Admitting: *Deleted

## 2016-08-08 NOTE — Telephone Encounter (Signed)
Records release received. Records transferred to [facility], [place].  Routing to provider for final review.  Will close encounter.
